# Patient Record
Sex: Female | Born: 1950 | Race: White | Hispanic: No | Marital: Married | Smoking: Former smoker
Health system: Southern US, Community
[De-identification: ages and names within clinical notes are randomized; demographics above are authoritative.]

## PROBLEM LIST (undated history)

## (undated) DIAGNOSIS — F32A Depression, unspecified: Secondary | ICD-10-CM

## (undated) DIAGNOSIS — E079 Disorder of thyroid, unspecified: Secondary | ICD-10-CM

## (undated) DIAGNOSIS — F329 Major depressive disorder, single episode, unspecified: Secondary | ICD-10-CM

## (undated) DIAGNOSIS — E785 Hyperlipidemia, unspecified: Secondary | ICD-10-CM

## (undated) DIAGNOSIS — I10 Essential (primary) hypertension: Secondary | ICD-10-CM

## (undated) HISTORY — DX: Disorder of thyroid, unspecified: E07.9

## (undated) HISTORY — DX: Major depressive disorder, single episode, unspecified: F32.9

## (undated) HISTORY — DX: Essential (primary) hypertension: I10

## (undated) HISTORY — DX: Depression, unspecified: F32.A

## (undated) HISTORY — DX: Hyperlipidemia, unspecified: E78.5

---

## 2001-09-22 ENCOUNTER — Other Ambulatory Visit: Admission: RE | Admit: 2001-09-22 | Discharge: 2001-09-22 | Payer: Self-pay | Admitting: Family Medicine

## 2003-05-10 ENCOUNTER — Other Ambulatory Visit: Admission: RE | Admit: 2003-05-10 | Discharge: 2003-05-10 | Payer: Self-pay | Admitting: Family Medicine

## 2005-03-23 ENCOUNTER — Other Ambulatory Visit: Admission: RE | Admit: 2005-03-23 | Discharge: 2005-03-23 | Payer: Self-pay | Admitting: Family Medicine

## 2006-05-25 ENCOUNTER — Other Ambulatory Visit: Admission: RE | Admit: 2006-05-25 | Discharge: 2006-05-25 | Payer: Self-pay | Admitting: Family Medicine

## 2006-06-16 ENCOUNTER — Encounter (HOSPITAL_COMMUNITY): Admission: RE | Admit: 2006-06-16 | Discharge: 2006-07-16 | Payer: Self-pay | Admitting: Endocrinology

## 2006-07-30 ENCOUNTER — Encounter (HOSPITAL_COMMUNITY): Admission: RE | Admit: 2006-07-30 | Discharge: 2006-08-29 | Payer: Self-pay | Admitting: Endocrinology

## 2007-10-30 IMAGING — NM NM THYROID IMAGING ONLY
4 series · 4 of 4 positions shown · non-contrast
Comparison: none

CLINICAL DATA: 55-year-old woman, history of thyroid goiter.  
 THYROID IMAGING STUDY:
 Thyroid scan was performed after the intravenous injection of approximately 10 mCi UcTTm Pertechnetate.  Images were performed in the anterior LAO and RAO projection.  These images showed a slightly enlarged gland with minimal thinning and/or patchy distribution of activity.  However, there were no discrete masses or nodules seen.  The Technetium visual uptake estimate was approximately 50% which is normal.

[Series 1: th thyroid scan · 1.09mm/px · 1 of 1 slices shown (1 of 4)]
[im 1/1]
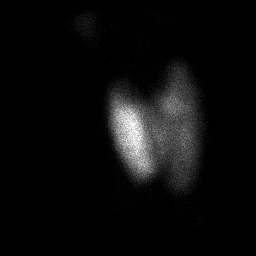

[Series 1: th thyroid scan · 1.09mm/px · 1 of 1 slices shown (2 of 4)]
[im 1/1]
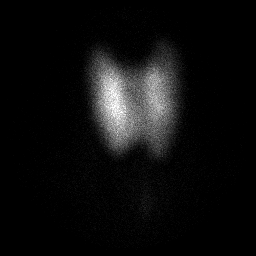

[Series 1: th thyroid scan · 1.09mm/px · 1 of 1 slices shown (3 of 4)]
[im 1/1]
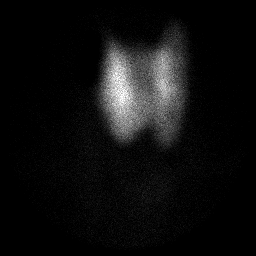

[Series 1: th thyroid scan · 1.09mm/px · 1 of 1 slices shown (4 of 4)]
[im 1/1]
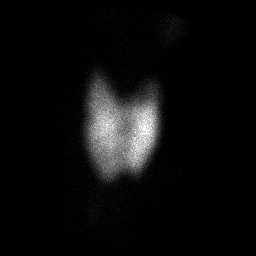

[4 of 4 positions shown; findings below may reference images not displayed]

IMPRESSION: 1.  Normal Technetium visual uptake estimate of about 15%. 
 2.  Mildly enlarged thyroid with minimal thinning and/or patchiness but without discrete cold or hot nodules.

## 2010-08-24 ENCOUNTER — Encounter: Payer: Self-pay | Admitting: Endocrinology

## 2012-10-15 ENCOUNTER — Other Ambulatory Visit: Payer: Self-pay | Admitting: *Deleted

## 2012-10-15 DIAGNOSIS — Z780 Asymptomatic menopausal state: Secondary | ICD-10-CM

## 2012-11-23 ENCOUNTER — Ambulatory Visit (INDEPENDENT_AMBULATORY_CARE_PROVIDER_SITE_OTHER): Payer: BC Managed Care – PPO | Admitting: Pharmacist

## 2012-11-23 ENCOUNTER — Other Ambulatory Visit: Payer: Self-pay

## 2012-11-23 ENCOUNTER — Ambulatory Visit (INDEPENDENT_AMBULATORY_CARE_PROVIDER_SITE_OTHER): Payer: BC Managed Care – PPO

## 2012-11-23 VITALS — BP 124/68 | HR 70 | Ht 62.5 in | Wt 170.0 lb

## 2012-11-23 DIAGNOSIS — I10 Essential (primary) hypertension: Secondary | ICD-10-CM | POA: Insufficient documentation

## 2012-11-23 DIAGNOSIS — M8580 Other specified disorders of bone density and structure, unspecified site: Secondary | ICD-10-CM | POA: Insufficient documentation

## 2012-11-23 DIAGNOSIS — M899 Disorder of bone, unspecified: Secondary | ICD-10-CM

## 2012-11-23 DIAGNOSIS — E785 Hyperlipidemia, unspecified: Secondary | ICD-10-CM

## 2012-11-23 DIAGNOSIS — E039 Hypothyroidism, unspecified: Secondary | ICD-10-CM

## 2012-11-23 DIAGNOSIS — Z780 Asymptomatic menopausal state: Secondary | ICD-10-CM

## 2012-11-23 DIAGNOSIS — E782 Mixed hyperlipidemia: Secondary | ICD-10-CM | POA: Insufficient documentation

## 2012-11-23 DIAGNOSIS — F32A Depression, unspecified: Secondary | ICD-10-CM | POA: Insufficient documentation

## 2012-11-23 DIAGNOSIS — F329 Major depressive disorder, single episode, unspecified: Secondary | ICD-10-CM

## 2012-11-23 NOTE — Progress Notes (Signed)
[facility] Current Height: Height: 5' 2.5" (158.8 cm)      Max Lifetime Height:  5' 2.5"    Current Weight: Weight: 170 lb (77.111 kg)       Ethnicity:Caucasian  BP: BP: 124/68 mmHg     HR:  Pulse Rate: 70      HPI: Does pt already have a diagnosis of:  Osteopenia?  Yes Osteoporosis?  No  Back Pain?  No       Kyphosis?  No Prior fracture?  No Med(s) for Osteoporosis/Osteopenia:  none Med(s) previously tried for Osteoporosis/Osteopenia:  Fosamax - patient stopped because to expensive  Patient also requests refill on atorvastatin 10mg  1 tablet daily.  Her last lipid panel was 03/2012.  LDL-P was 1059, Tg - 181, ZO-109, HDL - 64.  Patient's previous PCP was Paulita Cradle, NP who is no longer at our practice.  Patient need appt to establish care with new PCP.                                                             PMH: Age at menopause:  62yo Hysterectomy?  No Oophorectomy?  No HRT? Yes - Former.  Type/duration: - prempro, activella, estradiol +  medroxyprogesterone Steroid Use?  No Thyroid med?  Yes History of cancer?  No History of digestive disorders (ie Crohn's)?  No Current or previous eating disorders?  No Last Vitamin D Result:  32 (03/2012) Last GFR Result:  89 (03/2012)   FH/SH: Family history of osteoporosis?  No Parent with history of hip fracture?  No Family history of breast cancer?  No Exercise?  Yes -walking some Caffeine?  Yes - 1 cup coffee daily, 2 sodas daily Smoking?  No Alcohol?  No    Calcium Assessment Calcium Intake  # of servings/day  Calcium mg  Milk (8 oz) 2  x  300  = 600mg   Yogurt (4 oz) 3x/wk x  200 = 100 mg  Cheese (1 oz) varies x  200 =    Other Calcium sources     Ca supplement None = 0   Estimated calcium intake per day 700mg     DEXA Results Date of Test T-Score for AP Spine L1-L4 T-Score for Total Left Hip T-Score for Total Right Hip  11/23/2012 -0.6 -1.3 -1.4                   Previsou DEXA results from Dr.  Patrecia Pace from 07/18/2008:  Lumbar spine (L1-L4)  T-Score = =0.06  Total Hip  T-Score = -1.29  Femerol Neck T-Score = -1.49  FRAX 10 year estimate: Total FX risk:  7.6%  (consider medication if >/= 20%) Hip FX risk:  0.6% (consider medication if >/= 3%)  Assessment: Osteopenia - stable Low calcium intake Hyperlipidemia - due recheck labs but not fasting today.   Recommendations: recommend calcium 1200mg  daily either through supplementation or diet.   recommend weight bearing exercise - 30 minutes at least 4 days per week.   Counseled and educated about fall risk and prevention Rx refill called in for atorvastatin 10mg  1 tablet daily.   #30/0 RF and appt made with Ruthell Rummage for labs and follow-up chronic conditions for May 2014 Recheck DEXA:  2 years  Time spent counseling patient:  30 minutes

## 2012-11-23 NOTE — Patient Instructions (Signed)
Increase Calcium intake to 1200mg  daily through diet and supplements Weight bearing exercie (such as walking) daily at least 4 days per week    Fall Prevention and Home Safety Falls cause injuries and can affect all age groups. It is possible to use preventive measures to significantly decrease the likelihood of falls. There are many simple measures which can make your home safer and prevent falls. OUTDOORS  Repair cracks and edges of walkways and driveways.  Remove high doorway thresholds.  Trim shrubbery on the main path into your home.  Have good outside lighting.  Clear walkways of tools, rocks, debris, and clutter.  Check that handrails are not broken and are securely fastened. Both sides of steps should have handrails.  Have leaves, snow, and ice cleared regularly.  Use sand or salt on walkways during winter months.  In the garage, clean up grease or oil spills. BATHROOM  Install night lights.  Install grab bars by the toilet and in the tub and shower.  Use non-skid mats or decals in the tub or shower.  Place a plastic non-slip stool in the shower to sit on, if needed.  Keep floors dry and clean up all water on the floor immediately.  Remove soap buildup in the tub or shower on a regular basis.  Secure bath mats with non-slip, double-sided rug tape.  Remove throw rugs and tripping hazards from the floors. BEDROOMS  Install night lights.  Make sure a bedside light is easy to reach.  Do not use oversized bedding.  Keep a telephone by your bedside.  Have a firm chair with side arms to use for getting dressed.  Remove throw rugs and tripping hazards from the floor. KITCHEN  Keep handles on pots and pans turned toward the center of the stove. Use back burners when possible.  Clean up spills quickly and allow time for drying.  Avoid walking on wet floors.  Avoid hot utensils and knives.  Position shelves so they are not too high or low.  Place  commonly used objects within easy reach.  If necessary, use a sturdy step stool with a grab bar when reaching.  Keep electrical cables out of the way.  Do not use floor polish or wax that makes floors slippery. If you must use wax, use non-skid floor wax.  Remove throw rugs and tripping hazards from the floor. STAIRWAYS  Never leave objects on stairs.  Place handrails on both sides of stairways and use them. Fix any loose handrails. Make sure handrails on both sides of the stairways are as long as the stairs.  Check carpeting to make sure it is firmly attached along stairs. Make repairs to worn or loose carpet promptly.  Avoid placing throw rugs at the top or bottom of stairways, or properly secure the rug with carpet tape to prevent slippage. Get rid of throw rugs, if possible.  Have an electrician put in a light switch at the top and bottom of the stairs. OTHER FALL PREVENTION TIPS  Wear low-heel or rubber-soled shoes that are supportive and fit well. Wear closed toe shoes.  When using a stepladder, make sure it is fully opened and both spreaders are firmly locked. Do not climb a closed stepladder.  Add color or contrast paint or tape to grab bars and handrails in your home. Place contrasting color strips on first and last steps.  Learn and use mobility aids as needed. Install an electrical emergency response system.  Turn on lights to avoid dark  areas. Replace light bulbs that burn out immediately. Get light switches that glow.  Arrange furniture to create clear pathways. Keep furniture in the same place.  Firmly attach carpet with non-skid or double-sided tape.  Eliminate uneven floor surfaces.  Select a carpet pattern that does not visually hide the edge of steps.  Be aware of all pets. OTHER HOME SAFETY TIPS  Set the water temperature for 120 F (48.8 C).  Keep emergency numbers on or near the telephone.  Keep smoke detectors on every level of the home and near  sleeping areas. Document Released: 07/10/2002 Document Revised: 01/19/2012 Document Reviewed: 10/09/2011 [facility] Patient Information 2013 [place], [place].

## 2012-12-23 ENCOUNTER — Ambulatory Visit: Payer: Self-pay | Admitting: General Practice

## 2013-01-05 ENCOUNTER — Ambulatory Visit (INDEPENDENT_AMBULATORY_CARE_PROVIDER_SITE_OTHER): Payer: BC Managed Care – PPO | Admitting: General Practice

## 2013-01-05 ENCOUNTER — Encounter: Payer: Self-pay | Admitting: General Practice

## 2013-01-05 VITALS — BP 138/74 | HR 56 | Temp 97.4°F | Ht 62.0 in | Wt 166.0 lb

## 2013-01-05 DIAGNOSIS — E039 Hypothyroidism, unspecified: Secondary | ICD-10-CM

## 2013-01-05 DIAGNOSIS — F3289 Other specified depressive episodes: Secondary | ICD-10-CM

## 2013-01-05 DIAGNOSIS — E785 Hyperlipidemia, unspecified: Secondary | ICD-10-CM

## 2013-01-05 DIAGNOSIS — F329 Major depressive disorder, single episode, unspecified: Secondary | ICD-10-CM

## 2013-01-05 DIAGNOSIS — I10 Essential (primary) hypertension: Secondary | ICD-10-CM

## 2013-01-05 DIAGNOSIS — F32A Depression, unspecified: Secondary | ICD-10-CM

## 2013-01-05 LAB — POCT CBC
Granulocyte percent: 60.6 %G (ref 37–80)
HCT, POC: 39.7 % (ref 37.7–47.9)
MCV: 80.3 fL (ref 80–97)
POC Granulocyte: 4.5 (ref 2–6.9)
RBC: 4.9 M/uL (ref 4.04–5.48)

## 2013-01-05 LAB — COMPLETE METABOLIC PANEL WITH GFR
AST: 17 U/L (ref 0–37)
Albumin: 4.4 g/dL (ref 3.5–5.2)
Alkaline Phosphatase: 80 U/L (ref 39–117)
BUN: 13 mg/dL (ref 6–23)
Calcium: 9.5 mg/dL (ref 8.4–10.5)
Chloride: 107 mEq/L (ref 96–112)
GFR, Est Non African American: >89 mL/min
Glucose, Bld: 90 mg/dL (ref 70–99)
Potassium: 4 mEq/L (ref 3.5–5.3)
Sodium: 143 mEq/L (ref 135–145)
Total Protein: 6.9 g/dL (ref 6.0–8.3)

## 2013-01-05 LAB — THYROID PANEL WITH TSH
Free Thyroxine Index: 2.1 (ref 1.0–3.9)
T3 Uptake: 32.9 % (ref 22.5–37.0)

## 2013-01-05 MED ORDER — AMLODIPINE BESYLATE 5 MG PO TABS: 5.0000 mg | ORAL_TABLET | Freq: Every day | ORAL | Status: DC

## 2013-01-05 MED ORDER — LEVOTHYROXINE SODIUM 75 MCG PO TABS: 75.0000 ug | ORAL_TABLET | Freq: Every day | ORAL | Status: DC

## 2013-01-05 MED ORDER — ATORVASTATIN CALCIUM 10 MG PO TABS: 10.0000 mg | ORAL_TABLET | Freq: Every day | ORAL | Status: DC

## 2013-01-05 MED ORDER — SERTRALINE HCL 50 MG PO TABS: 50.0000 mg | ORAL_TABLET | Freq: Every day | ORAL | Status: DC

## 2013-01-05 NOTE — Patient Instructions (Signed)
Hypertension As your heart beats, it forces blood through your arteries. This force is your blood pressure. If the pressure is too high, it is called hypertension (HTN) or high blood pressure. HTN is dangerous because you may have it and not know it. High blood pressure may mean that your heart has to work harder to pump blood. Your arteries may be narrow or stiff. The extra work puts you at risk for heart disease, stroke, and other problems.  Blood pressure consists of two numbers, a higher number over a lower, 110/72, for example. It is stated as "110 over 72." The ideal is below 120 for the top number (systolic) and under 80 for the bottom (diastolic). Write down your blood pressure today. You should pay close attention to your blood pressure if you have certain conditions such as:  Heart failure.  Prior heart attack.  Diabetes  Chronic kidney disease.  Prior stroke.  Multiple risk factors for heart disease. To see if you have HTN, your blood pressure should be measured while you are seated with your arm held at the level of the heart. It should be measured at least twice. A one-time elevated blood pressure reading (especially in the Emergency Department) does not mean that you need treatment. There may be conditions in which the blood pressure is different between your right and left arms. It is important to see your caregiver soon for a recheck. Most people have essential hypertension which means that there is not a specific cause. This type of high blood pressure may be lowered by changing lifestyle factors such as:  Stress.  Smoking.  Lack of exercise.  Excessive weight.  Drug/tobacco/alcohol use.  Eating less salt. Most people do not have symptoms from high blood pressure until it has caused damage to the body. Effective treatment can often prevent, delay or reduce that damage. TREATMENT  When a cause has been identified, treatment for high blood pressure is directed at the  cause. There are a large number of medications to treat HTN. These fall into several categories, and your caregiver will help you select the medicines that are best for you. Medications may have side effects. You should review side effects with your caregiver. If your blood pressure stays high after you have made lifestyle changes or started on medicines,   Your medication(s) may need to be changed.  Other problems may need to be addressed.  Be certain you understand your prescriptions, and know how and when to take your medicine.  Be sure to follow up with your caregiver within the time frame advised (usually within two weeks) to have your blood pressure rechecked and to review your medications.  If you are taking more than one medicine to lower your blood pressure, make sure you know how and at what times they should be taken. Taking two medicines at the same time can result in blood pressure that is too low. SEEK IMMEDIATE MEDICAL CARE IF:  You develop a severe headache, blurred or changing vision, or confusion.  You have unusual weakness or numbness, or a faint feeling.  You have severe chest or abdominal pain, vomiting, or breathing problems. MAKE SURE YOU:   Understand these instructions.  Will watch your condition.  Will get help right away if you are not doing well or get worse. Document Released: 07/20/2005 Document Revised: 10/12/2011 Document Reviewed: 03/09/2008 [facility] Patient Information 2014 [place], [place]. Hyperthyroidism The thyroid is a large gland located in the lower front part of your neck.  The thyroid helps control metabolism. Metabolism is how your body uses food. It controls metabolism with the hormone thyroxine. When the thyroid is overactive, it produces too much hormone. When this happens, these following problems may occur:   Nervousness  Heat intolerance  Weight loss (in spite of increase food intake)  Diarrhea  Change in hair or skin  texture  Palpitations (heart skipping or having extra beats)  Tachycardia (rapid heart rate)  Loss of menstruation (amenorrhea)  Shaking of the hands CAUSES  Grave's Disease (the immune system attacks the thyroid gland). This is the most common cause.  Inflammation of the thyroid gland.  Tumor (usually benign) in the thyroid gland or elsewhere.  Excessive use of thyroid medications (both prescription and 'natural').  Excessive ingestion of Iodine. DIAGNOSIS  To prove hyperthyroidism, your caregiver may do blood tests and ultrasound tests. Sometimes the signs are hidden. It may be necessary for your caregiver to watch this illness with blood tests, either before or after diagnosis and treatment. TREATMENT Short-term treatment There are several treatments to control symptoms. Drugs called beta blockers may give some relief. Drugs that decrease hormone production will provide temporary relief in many people. These measures will usually not give permanent relief. Definitive therapy There are treatments available which can be discussed between you and your caregiver which will permanently treat the problem. These treatments range from surgery (removal of the thyroid), to the use of radioactive iodine (destroys the thyroid by radiation), to the use of antithyroid drugs (interfere with hormone synthesis). The first two treatments are permanent and usually successful. They most often require hormone replacement therapy for life. This is because it is impossible to remove or destroy the exact amount of thyroid required to make a person euthyroid (normal). HOME CARE INSTRUCTIONS  See your caregiver if the problems you are being treated for get worse. Examples of this would be the problems listed above. SEEK MEDICAL CARE IF: Your general condition worsens. MAKE SURE YOU:   Understand these instructions.  Will watch your condition.  Will get help right away if you are not doing well or get  worse. Document Released: 07/20/2005 Document Revised: 10/12/2011 Document Reviewed: 12/01/2006 [facility] Patient Information 2014 [place], [place].

## 2013-01-05 NOTE — Progress Notes (Signed)
  Subjective:    Patient ID: Lorraine Strickland, female    DOB: 1951/03/02, 62 y.o.   MRN: [id number]  HPI Patient presents today for 3 month follow up of chronic health conditions. Hypertension, hyperlipidemia, depression, hypothyroidism. Reports taking medications as directed. Denies taking blood pressure at home. Reports mood is well controlled with medications.     Review of Systems  Constitutional: Negative for fever and chills.  HENT: Negative for ear pain and neck pain.   Respiratory: Negative for chest tightness, shortness of breath and wheezing.   Cardiovascular: Negative for chest pain and palpitations.  Gastrointestinal: Negative for abdominal pain and abdominal distention.  Genitourinary: Negative for dysuria, frequency, hematuria and difficulty urinating.  Skin: Negative.   Neurological: Negative for dizziness, weakness and headaches.  Psychiatric/Behavioral: Negative for suicidal ideas and self-injury.  All other systems reviewed and are negative.       Objective:   Physical Exam  Constitutional: She is oriented to person, place, and time. She appears well-developed and well-nourished.  HENT:  Head: Normocephalic and atraumatic.  Right Ear: External ear normal.  Left Ear: External ear normal.  Mouth/Throat: Oropharynx is clear and moist.  Eyes: EOM are normal.  Cardiovascular: Normal rate, regular rhythm and normal heart sounds.   Pulmonary/Chest: Effort normal and breath sounds normal. No respiratory distress. She exhibits no tenderness.  Neurological: She is alert and oriented to person, place, and time.  Skin: Skin is warm and dry.  Psychiatric: She has a normal mood and affect.          Assessment & Plan:  1. Essential hypertension, benign - POCT CBC - amLODipine (NORVASC) 5 MG tablet; Take 1 tablet (5 mg total) by mouth daily.  Dispense: 30 tablet; Refill: 3  2. Other and unspecified hyperlipidemia - COMPLETE METABOLIC PANEL WITH GFR - NMR Lipoprofile  with Lipids - atorvastatin (LIPITOR) 10 MG tablet; Take 1 tablet (10 mg total) by mouth daily.  Dispense: 30 tablet; Refill: 3  3. Hypothyroidism - Thyroid Panel With TSH - levothyroxine (SYNTHROID, LEVOTHROID) 75 MCG tablet; Take 1 tablet (75 mcg total) by mouth daily before breakfast.  Dispense: 30 tablet; Refill: 3  4. Depression - sertraline (ZOLOFT) 50 MG tablet; Take 1 tablet (50 mg total) by mouth daily.  Dispense: 30 tablet; Refill: 3 -continue meds as prescribed -continue working on healthy eating habits and exercise -RTO if symptoms develop -Patient verbalized understanding -Coralie Keens, FNP-C

## 2013-01-06 ENCOUNTER — Other Ambulatory Visit: Payer: Self-pay | Admitting: General Practice

## 2013-01-06 DIAGNOSIS — E039 Hypothyroidism, unspecified: Secondary | ICD-10-CM

## 2013-01-06 LAB — NMR LIPOPROFILE WITH LIPIDS
Cholesterol, Total: 204 mg/dL — ABNORMAL HIGH (ref <200)
LDL Particle Number: 1426 nmol/L — ABNORMAL HIGH (ref <1000)
Large HDL-P: 7.3 umol/L (ref >=4.8)
Large VLDL-P: 2.4 nmol/L (ref <=2.7)
Small LDL Particle Number: 435 nmol/L (ref <=527)
Triglycerides: 147 mg/dL (ref <150)
VLDL Size: 42.7 nm (ref <=46.6)

## 2013-01-06 MED ORDER — LEVOTHYROXINE SODIUM 88 MCG PO TABS: 88.0000 ug | ORAL_TABLET | Freq: Every day | ORAL | Status: DC

## 2013-01-09 NOTE — Progress Notes (Signed)
Husband aware.

## 2013-01-10 NOTE — Progress Notes (Signed)
Pt aware of labs and med at pharmacy

## 2013-02-20 ENCOUNTER — Telehealth: Payer: Self-pay | Admitting: General Practice

## 2013-02-21 NOTE — Telephone Encounter (Signed)
She should be taking and script sent on June 6 to [facility]. thx

## 2013-02-27 NOTE — Telephone Encounter (Signed)
Pt has been taking only half of thyroid med dose. Was given  by Dr.Morayati and told to take it that way  (four years earlier).  She wants to continue taking but wants to be sure of dose. If she needs higher dose, is it clear that she would be taking half a tablet equaling ?

## 2013-03-03 NOTE — Telephone Encounter (Signed)
Patient should follow up with physician who instructed her to take 1/2 dose if she wishes to continue taking that way. I will be glad to manage but would do so based on TSH level. thx

## 2013-03-06 NOTE — Telephone Encounter (Signed)
Patient had only been taking 1/2 of the 75 for 4 years. She is willing to go up but wasn't sure if she needed to go up to 88. I noticed that it had her as taking a whole levothyroxine instead of 1/2 so i think that might be where the confusion is coming in

## 2013-05-15 ENCOUNTER — Other Ambulatory Visit: Payer: Self-pay

## 2013-05-15 DIAGNOSIS — E785 Hyperlipidemia, unspecified: Secondary | ICD-10-CM

## 2013-05-15 MED ORDER — ATORVASTATIN CALCIUM 10 MG PO TABS: 10.0000 mg | ORAL_TABLET | Freq: Every day | ORAL | Status: DC

## 2013-05-18 ENCOUNTER — Other Ambulatory Visit: Payer: Self-pay | Admitting: General Practice

## 2013-05-22 ENCOUNTER — Telehealth: Payer: Self-pay | Admitting: General Practice

## 2013-05-22 DIAGNOSIS — E785 Hyperlipidemia, unspecified: Secondary | ICD-10-CM

## 2013-05-23 MED ORDER — ATORVASTATIN CALCIUM 10 MG PO TABS: 10.0000 mg | ORAL_TABLET | Freq: Every day | ORAL | Status: DC

## 2013-05-23 NOTE — Telephone Encounter (Signed)
done

## 2013-05-24 ENCOUNTER — Telehealth: Payer: Self-pay | Admitting: Nurse Practitioner

## 2013-05-24 NOTE — Telephone Encounter (Signed)
error 

## 2013-06-19 ENCOUNTER — Ambulatory Visit (INDEPENDENT_AMBULATORY_CARE_PROVIDER_SITE_OTHER): Payer: BC Managed Care – PPO | Admitting: Nurse Practitioner

## 2013-06-19 ENCOUNTER — Encounter: Payer: Self-pay | Admitting: Nurse Practitioner

## 2013-06-19 VITALS — BP 142/77 | HR 58 | Temp 97.1°F | Ht 62.0 in | Wt 164.0 lb

## 2013-06-19 DIAGNOSIS — E039 Hypothyroidism, unspecified: Secondary | ICD-10-CM

## 2013-06-19 DIAGNOSIS — Z0000 Encounter for general adult medical examination without abnormal findings: Secondary | ICD-10-CM

## 2013-06-19 DIAGNOSIS — Z01419 Encounter for gynecological examination (general) (routine) without abnormal findings: Secondary | ICD-10-CM

## 2013-06-19 DIAGNOSIS — E782 Mixed hyperlipidemia: Secondary | ICD-10-CM

## 2013-06-19 DIAGNOSIS — F329 Major depressive disorder, single episode, unspecified: Secondary | ICD-10-CM

## 2013-06-19 DIAGNOSIS — Z124 Encounter for screening for malignant neoplasm of cervix: Secondary | ICD-10-CM

## 2013-06-19 DIAGNOSIS — Z23 Encounter for immunization: Secondary | ICD-10-CM

## 2013-06-19 DIAGNOSIS — E785 Hyperlipidemia, unspecified: Secondary | ICD-10-CM

## 2013-06-19 DIAGNOSIS — I10 Essential (primary) hypertension: Secondary | ICD-10-CM

## 2013-06-19 DIAGNOSIS — F32A Depression, unspecified: Secondary | ICD-10-CM

## 2013-06-19 LAB — POCT CBC
Granulocyte percent: 59.3 %G (ref 37–80)
HCT, POC: 40.7 % (ref 37.7–47.9)
Lymph, poc: 2.6 (ref 0.6–3.4)
MCH, POC: 24.8 pg — AB (ref 27–31.2)
MCHC: 31.2 g/dL — AB (ref 31.8–35.4)
MCV: 79.5 fL — AB (ref 80–97)
Platelet Count, POC: 231 10*3/uL (ref 142–424)
RDW, POC: 13.9 %
WBC: 7.4 10*3/uL (ref 4.6–10.2)

## 2013-06-19 LAB — POCT URINALYSIS DIPSTICK
Glucose, UA: NEGATIVE
Leukocytes, UA: NEGATIVE
Nitrite, UA: NEGATIVE
Protein, UA: NEGATIVE
Urobilinogen, UA: NEGATIVE

## 2013-06-19 MED ORDER — HYDROCHLOROTHIAZIDE 12.5 MG PO CAPS: 12.5000 mg | ORAL_CAPSULE | Freq: Every day | ORAL | Status: DC

## 2013-06-19 MED ORDER — SERTRALINE HCL 50 MG PO TABS: 50.0000 mg | ORAL_TABLET | Freq: Every day | ORAL | Status: DC

## 2013-06-19 MED ORDER — AMLODIPINE BESYLATE 5 MG PO TABS: 5.0000 mg | ORAL_TABLET | Freq: Every day | ORAL | Status: DC

## 2013-06-19 MED ORDER — ATORVASTATIN CALCIUM 10 MG PO TABS: 10.0000 mg | ORAL_TABLET | Freq: Every day | ORAL | Status: DC

## 2013-06-19 NOTE — Progress Notes (Signed)
Subjective:    Patient ID: Lorraine Strickland, female    DOB: [birth year], 62 y.o.   MRN: [id number]   Patient here today for CPE and PAP- doing well without complaints.  Hypertension This is a chronic problem. The current episode started more than 1 year ago. The problem is unchanged. The problem is controlled. Pertinent negatives include no blurred vision, chest pain, headaches, neck pain, palpitations, peripheral edema or shortness of breath. Agents associated with hypertension include thyroid hormones. Risk factors for coronary artery disease include dyslipidemia, family history and post-menopausal state. Past treatments include calcium channel blockers and diuretics. The current treatment provides moderate improvement. Compliance problems include diet and exercise.  Hypertensive end-organ damage includes a thyroid problem.  Hyperlipidemia This is a chronic problem. The current episode started more than 1 year ago. The problem is uncontrolled. Recent lipid tests were reviewed and are high. Exacerbating diseases include hypothyroidism. She has no history of diabetes or obesity. Pertinent negatives include no chest pain or shortness of breath. Current antihyperlipidemic treatment includes statins. The current treatment provides moderate improvement of lipids. Compliance problems include adherence to diet and adherence to exercise.  Risk factors for coronary artery disease include hypertension and post-menopausal.  Thyroid Problem Visit type: hypothyroidism. Patient reports no anxiety, constipation, diaphoresis, diarrhea, dry skin, hair loss, hoarse voice, menstrual problem, palpitations, visual change, weight gain or weight loss. The symptoms have been stable. Her past medical history is significant for hyperlipidemia. There is no history of diabetes.  depression zoloft working well for her- keeps her from worrying so much   Review of Systems  Constitutional: Negative for weight loss, weight gain and  diaphoresis.  HENT: Negative for hoarse voice.   Eyes: Negative for blurred vision.  Respiratory: Negative for shortness of breath.   Cardiovascular: Negative for chest pain and palpitations.  Gastrointestinal: Negative for diarrhea and constipation.  Genitourinary: Negative for menstrual problem.  Musculoskeletal: Negative for neck pain.  Neurological: Negative for headaches.       Objective:   Physical Exam  Constitutional: She is oriented to person, place, and time. She appears well-developed and well-nourished.  HENT:  Head: Normocephalic.  Right Ear: Hearing, tympanic membrane, external ear and ear canal normal.  Left Ear: Hearing, tympanic membrane, external ear and ear canal normal.  Nose: Nose normal.  Mouth/Throat: Uvula is midline and oropharynx is clear and moist.  Eyes: Conjunctivae and EOM are normal. Pupils are equal, round, and reactive to light.  Neck: Normal range of motion and full passive range of motion without pain. Neck supple. No JVD present. Carotid bruit is not present. No mass and no thyromegaly present.  Cardiovascular: Normal rate, normal heart sounds and intact distal pulses.   No murmur heard. Pulmonary/Chest: Effort normal and breath sounds normal. Right breast exhibits no inverted nipple, no mass, no nipple discharge, no skin change and no tenderness. Left breast exhibits no mass, no nipple discharge, no skin change and no tenderness.  Abdominal: Soft. Bowel sounds are normal. She exhibits no mass. There is no tenderness.  Genitourinary: Vagina normal and uterus normal. No breast swelling, tenderness, discharge or bleeding.  bimanual exam-No adnexal masses or tenderness. Cervix non parous and pink - no discharge  Musculoskeletal: Normal range of motion.  Lymphadenopathy:    She has no cervical adenopathy.  Neurological: She is alert and oriented to person, place, and time.  Skin: Skin is warm and dry.  Psychiatric: She has a normal mood and affect.  Her behavior is normal.  Judgment and thought content normal.    BP 142/77  Pulse 58  Temp(Src) 97.1 F (36.2 C) (Oral)  Ht 5\' 2"  (1.575 m)  Wt 164 lb (74.39 kg)  BMI 29.99 kg/m2       Assessment & Plan:   1. Annual physical exam   2. Encounter for routine gynecological examination   3. Hyperlipemia, mixed   4. Essential hypertension, benign   5. Unspecified hypothyroidism   6. Depression   7. Other and unspecified hyperlipidemia    Orders Placed This Encounter  Procedures  . CMP14+EGFR  . NMR, lipoprofile  . Thyroid Panel With TSH  . POCT urinalysis dipstick  . POCT CBC   Meds ordered this encounter  Medications  . levothyroxine (SYNTHROID, LEVOTHROID) 75 MCG tablet    Sig: Take 75 mcg by mouth daily before breakfast. Take 1/2 tab qd  . sertraline (ZOLOFT) 50 MG tablet    Sig: Take 1 tablet (50 mg total) by mouth daily.    Dispense:  90 tablet    Refill:  1    Order Specific Question:  Supervising Provider    Answer:  Ernestina Penna [[id number]]  . amLODipine (NORVASC) 5 MG tablet    Sig: Take 1 tablet (5 mg total) by mouth daily.    Dispense:  90 tablet    Refill:  1    Order Specific Question:  Supervising Provider    Answer:  Ernestina Penna [[id number]]  . hydrochlorothiazide (MICROZIDE) 12.5 MG capsule    Sig: Take 1 capsule (12.5 mg total) by mouth daily.    Dispense:  90 capsule    Refill:  1    Order Specific Question:  Supervising Provider    Answer:  Ernestina Penna [[id number]]  . atorvastatin (LIPITOR) 10 MG tablet    Sig: Take 1 tablet (10 mg total) by mouth daily.    Dispense:  90 tablet    Refill:  1    Order Specific Question:  Supervising Provider    Answer:  Deborra Medina    Continue all meds Labs pending Diet and exercise encouraged Health maintenance reviewed Follow up in 6 months  Mary-Margaret Daphine Deutscher, FNP

## 2013-06-19 NOTE — Patient Instructions (Signed)

## 2013-06-21 LAB — CMP14+EGFR
AST: 21 IU/L (ref 0–40)
Albumin: 4.5 g/dL (ref 3.6–4.8)
Alkaline Phosphatase: 78 IU/L (ref 39–117)
BUN/Creatinine Ratio: 18 (ref 11–26)
BUN: 13 mg/dL (ref 8–27)
Chloride: 104 mmol/L (ref 97–108)
GFR calc Af Amer: 106 mL/min/{1.73_m2} (ref >59)
Sodium: 142 mmol/L (ref 134–144)
Total Bilirubin: 0.3 mg/dL (ref 0.0–1.2)

## 2013-06-21 LAB — NMR, LIPOPROFILE
Cholesterol: 174 mg/dL (ref <200)
HDL Particle Number: 44.3 umol/L (ref >=30.5)
LDL Particle Number: 1260 nmol/L — ABNORMAL HIGH (ref <1000)
LDL Size: 21 nm (ref >20.5)
LP-IR Score: 55 — ABNORMAL HIGH (ref <=45)

## 2013-06-21 LAB — THYROID PANEL WITH TSH
T3 Uptake Ratio: 26 % (ref 24–39)
TSH: 8.58 u[IU]/mL — ABNORMAL HIGH (ref 0.450–4.500)

## 2013-06-22 ENCOUNTER — Other Ambulatory Visit: Payer: Self-pay | Admitting: Nurse Practitioner

## 2013-06-22 MED ORDER — LEVOTHYROXINE SODIUM 88 MCG PO TABS: 88.0000 ug | ORAL_TABLET | Freq: Every day | ORAL | Status: DC

## 2013-06-23 LAB — PAP IG W/ RFLX HPV ASCU

## 2013-07-31 ENCOUNTER — Other Ambulatory Visit: Payer: Self-pay

## 2013-07-31 DIAGNOSIS — E785 Hyperlipidemia, unspecified: Secondary | ICD-10-CM

## 2013-07-31 MED ORDER — ATORVASTATIN CALCIUM 10 MG PO TABS: 10.0000 mg | ORAL_TABLET | Freq: Every day | ORAL | Status: DC

## 2013-08-15 ENCOUNTER — Telehealth: Payer: Self-pay | Admitting: *Deleted

## 2013-08-15 ENCOUNTER — Telehealth: Payer: Self-pay | Admitting: Nurse Practitioner

## 2013-08-15 ENCOUNTER — Ambulatory Visit (INDEPENDENT_AMBULATORY_CARE_PROVIDER_SITE_OTHER): Payer: BC Managed Care – PPO | Admitting: Family Medicine

## 2013-08-15 ENCOUNTER — Encounter: Payer: Self-pay | Admitting: Family Medicine

## 2013-08-15 VITALS — BP 124/67 | HR 71 | Temp 96.7°F | Ht 62.0 in | Wt 164.0 lb

## 2013-08-15 DIAGNOSIS — R059 Cough, unspecified: Secondary | ICD-10-CM

## 2013-08-15 DIAGNOSIS — R05 Cough: Secondary | ICD-10-CM

## 2013-08-15 DIAGNOSIS — J329 Chronic sinusitis, unspecified: Secondary | ICD-10-CM

## 2013-08-15 MED ORDER — AZELASTINE HCL 0.1 % NA SOLN: 1.0000 | Freq: Two times a day (BID) | NASAL | Status: AC

## 2013-08-15 MED ORDER — FLUTICASONE PROPIONATE 50 MCG/ACT NA SUSP: 2.0000 | Freq: Every day | NASAL | Status: AC

## 2013-08-15 MED ORDER — AMOXICILLIN-POT CLAVULANATE 875-125 MG PO TABS: 1.0000 | ORAL_TABLET | Freq: Two times a day (BID) | ORAL | Status: DC

## 2013-08-15 MED ORDER — FLUTICASONE PROPIONATE 50 MCG/ACT NA SUSP: 2.0000 | Freq: Every day | NASAL | Status: DC

## 2013-08-15 NOTE — Progress Notes (Signed)
Subjective:    Patient ID: Lorraine Strickland, female    DOB: 06/16/1951, 63 y.o.   MRN: [id number]  HPI Patient here today for congestion and head pressure. This has been going on for a couple of days. She'll take his head shoulders. There is some slight cough. In both ears are stopped up.     Patient Active Problem List   Diagnosis Date Noted  . Hyperlipemia, mixed 11/23/2012  . Unspecified hypothyroidism 11/23/2012  . Depression 11/23/2012  . Essential hypertension, benign 11/23/2012  . Osteopenia 11/23/2012   Outpatient Encounter Prescriptions as of 08/15/2013  Medication Sig  . amLODipine (NORVASC) 5 MG tablet Take 1 tablet (5 mg total) by mouth daily.  Marland Kitchen. atorvastatin (LIPITOR) 10 MG tablet Take 1 tablet (10 mg total) by mouth daily.  . hydrochlorothiazide (MICROZIDE) 12.5 MG capsule Take 1 capsule (12.5 mg total) by mouth daily.  Marland Kitchen. levothyroxine (SYNTHROID, LEVOTHROID) 88 MCG tablet Take 1 tablet (88 mcg total) by mouth daily.  . sertraline (ZOLOFT) 50 MG tablet Take 1 tablet (50 mg total) by mouth daily.    Review of Systems  Constitutional: Negative.   HENT: Positive for congestion, ear pain ("stopped up") and sinus pressure.   Eyes: Negative.   Respiratory: Positive for cough (little).   Cardiovascular: Negative.   Gastrointestinal: Negative.   Endocrine: Negative.   Genitourinary: Negative.   Musculoskeletal: Negative.   Skin: Negative.   Allergic/Immunologic: Negative.   Neurological: Negative.  Negative for dizziness and headaches.  Hematological: Negative.   Psychiatric/Behavioral: Negative.        Objective:   Physical Exam  Nursing note and vitals reviewed. Constitutional: She is oriented to person, place, and time. She appears well-developed and well-nourished. No distress.  HENT:  Head: Normocephalic and atraumatic.  Right Ear: External ear normal.  Left Ear: External ear normal.  Mouth/Throat: Oropharynx is clear and moist. No oropharyngeal exudate.   Nasal congestion and turbinate swelling bilaterally  Eyes: Conjunctivae and EOM are normal. Pupils are equal, round, and reactive to light. Right eye exhibits no discharge. Left eye exhibits no discharge. No scleral icterus.  Neck: Normal range of motion. Neck supple. No thyromegaly present.  Cardiovascular: Normal rate, regular rhythm, normal heart sounds and intact distal pulses.  Exam reveals no gallop and no friction rub.   No murmur heard. Pulmonary/Chest: Effort normal and breath sounds normal. No respiratory distress. She has no wheezes. She has no rales. She exhibits no tenderness.  Upper airway congestion with coughing  Abdominal: Soft. Bowel sounds are normal.  Musculoskeletal: Normal range of motion.  Lymphadenopathy:    She has no cervical adenopathy.  Neurological: She is alert and oriented to person, place, and time.  Skin: Skin is warm and dry. No rash noted.  Psychiatric: She has a normal mood and affect. Her behavior is normal. Judgment and thought content normal.   BP 124/67  Pulse 71  Temp(Src) 96.7 F (35.9 C) (Oral)  Ht 5\' 2"  (1.575 m)  Wt 164 lb (74.39 kg)  BMI 29.99 kg/m2        Assessment & Plan:   1. Rhinosinusitis -Augmentin 875 one twice a day x10 day -Fluticasone nasal spray 1-2 sprays each nostril at bedtime -Astelin nose spray 2 sprays each nostril at bedtime  2. Cough -Mucinex maximum strength one twice daily with a large glass of water -Saline nose spray throughout the day  Patient Instructions  Take medication as directed Use a cool mist humidifier in her bedroom  at nighttime Use saline nose spray frequently during the day He is a prescriptionat bedtime Use Mucinex maximum strength one twice daily over-the-counter with a large glass of water   Nyra Capes MD

## 2013-08-15 NOTE — Telephone Encounter (Signed)
Pharm saved - new - in epic

## 2013-08-15 NOTE — Patient Instructions (Signed)
Take medication as directed Use a cool mist humidifier in her bedroom at nighttime Use saline nose spray frequently during the day He is a prescriptionat bedtime Use Mucinex maximum strength one twice daily over-the-counter with a large glass of water

## 2013-08-22 NOTE — Telephone Encounter (Signed)
Seen 08/15/13 at [facility] by dr. Christell Constantmoore

## 2013-11-13 ENCOUNTER — Telehealth: Payer: Self-pay | Admitting: Nurse Practitioner

## 2013-11-13 NOTE — Telephone Encounter (Signed)
app[facility] scheduled for 8:15 [facility]

## 2013-11-14 ENCOUNTER — Encounter: Payer: Self-pay | Admitting: Nurse Practitioner

## 2013-11-14 ENCOUNTER — Ambulatory Visit (INDEPENDENT_AMBULATORY_CARE_PROVIDER_SITE_OTHER): Payer: BC Managed Care – PPO | Admitting: Nurse Practitioner

## 2013-11-14 VITALS — BP 126/79 | HR 60 | Temp 98.5°F | Ht 62.0 in | Wt 165.6 lb

## 2013-11-14 DIAGNOSIS — J209 Acute bronchitis, unspecified: Secondary | ICD-10-CM

## 2013-11-14 MED ORDER — AZITHROMYCIN 250 MG PO TABS: ORAL_TABLET | ORAL | Status: DC

## 2013-11-14 MED ORDER — BENZONATATE 100 MG PO CAPS: 100.0000 mg | ORAL_CAPSULE | Freq: Two times a day (BID) | ORAL | Status: DC | PRN

## 2013-11-14 MED ORDER — HYDROCODONE-HOMATROPINE 5-1.5 MG/5ML PO SYRP: 5.0000 mL | ORAL_SOLUTION | Freq: Three times a day (TID) | ORAL | Status: DC | PRN

## 2013-11-14 MED ORDER — METHYLPREDNISOLONE ACETATE 80 MG/ML IJ SUSP
80.0000 mg | Freq: Once | INTRAMUSCULAR | Status: AC
  Administered 2013-11-14: 80 mg via INTRAMUSCULAR

## 2013-11-14 NOTE — Patient Instructions (Signed)

## 2013-11-14 NOTE — Progress Notes (Signed)
Subjective:    Patient ID: Lorraine Strickland, female    DOB: 10/14/1950, 63 y.o.   MRN: [id number]  HPI Patient in this morning c/o cough and congestion- started Saturday morning-Has tried mucinea OTC- no relief.    Review of Systems  Constitutional: Positive for chills and appetite change. Negative for fever.  HENT: Positive for congestion, postnasal drip and sore throat (started this AM). Negative for ear pain, sinus pressure and sneezing.   Respiratory: Positive for cough (nonproductive).   Cardiovascular: Negative.   Gastrointestinal: Negative.   Neurological: Negative.   Psychiatric/Behavioral: Negative.   All other systems reviewed and are negative.      Objective:   Physical Exam  Constitutional: She is oriented to person, place, and time. She appears well-developed and well-nourished.  HENT:  Right Ear: Hearing, tympanic membrane, external ear and ear canal normal.  Left Ear: Hearing, tympanic membrane, external ear and ear canal normal.  Nose: Mucosal edema and rhinorrhea present. Right sinus exhibits no maxillary sinus tenderness and no frontal sinus tenderness. Left sinus exhibits no maxillary sinus tenderness and no frontal sinus tenderness.  Mouth/Throat: Uvula is midline, oropharynx is clear and moist and mucous membranes are normal.  Eyes: Pupils are equal, round, and reactive to light.  Neck: Normal range of motion. Neck supple.  Cardiovascular: Normal rate, regular rhythm and normal heart sounds.   Pulmonary/Chest: Effort normal and breath sounds normal. No respiratory distress. She has no wheezes.  Deep dry cough  Lymphadenopathy:    She has no cervical adenopathy.  Neurological: She is alert and oriented to person, place, and time.  Skin: Skin is warm and dry.  Psychiatric: She has a normal mood and affect. Her behavior is normal. Judgment and thought content normal.   BP 126/79  Pulse 60  Temp(Src) 98.5 F (36.9 C) (Oral)  Ht 5\' 2"  (1.575 m)  Wt 165 lb  9.6 oz (75.116 kg)  BMI 30.28 kg/m2        Assessment & Plan:   1. Acute bronchitis    Meds ordered this encounter  Medications  . methylPREDNISolone acetate (DEPO-MEDROL) injection 80 mg    Sig:   . azithromycin (ZITHROMAX Z-PAK) 250 MG tablet    Sig: As directed    Dispense:  6 each    Refill:  0    Order Specific Question:  Supervising Provider    Answer:  Ernestina PennaMOORE, DONALD W [[id number]]  . benzonatate (TESSALON) 100 MG capsule    Sig: Take 1 capsule (100 mg total) by mouth 2 (two) times daily as needed for cough.    Dispense:  20 capsule    Refill:  0    Order Specific Question:  Supervising Provider    Answer:  Ernestina PennaMOORE, DONALD W [[id number]]  . HYDROcodone-homatropine (HYCODAN) 5-1.5 MG/5ML syrup    Sig: Take 5 mLs by mouth every 8 (eight) hours as needed for cough.    Dispense:  120 mL    Refill:  0    Order Specific Question:  Supervising Provider    Answer:  Ernestina PennaMOORE, DONALD W [[id number]]   1. Take meds as prescribed 2. Use a cool mist humidifier especially during the winter months and when heat has been humid. 3. Use saline nose sprays frequently 4. Saline irrigations of the nose can be very helpful if done frequently.  * 4X daily for 1 week*  * Use of a nettie pot can be helpful with this. Follow directions with this* 5. Drink plenty of  fluids 6. Keep thermostat turn down low 7.For any cough or congestion  Use plain Mucinex- regular strength or max strength is fine   * Children- consult with Pharmacist for dosing 8. For fever or aces or pains- take tylenol or ibuprofen appropriate for age and weight.  * for fevers greater than 101 orally you may alternate ibuprofen and tylenol every  3 hours.   Lorraine Daphine DeutscherMartin, FNP

## 2013-12-28 ENCOUNTER — Other Ambulatory Visit: Payer: Self-pay | Admitting: Nurse Practitioner

## 2014-01-01 ENCOUNTER — Other Ambulatory Visit: Payer: Self-pay

## 2014-01-01 MED ORDER — SERTRALINE HCL 50 MG PO TABS: ORAL_TABLET | ORAL | Status: DC

## 2014-01-01 MED ORDER — AMLODIPINE BESYLATE 5 MG PO TABS: ORAL_TABLET | ORAL | Status: DC

## 2014-01-22 ENCOUNTER — Other Ambulatory Visit: Payer: Self-pay | Admitting: Nurse Practitioner

## 2014-02-16 ENCOUNTER — Other Ambulatory Visit: Payer: Self-pay | Admitting: General Practice

## 2014-02-19 NOTE — Telephone Encounter (Signed)
Patient had thyroid checked in Nov 2014. Was to have levels repeated in 6-8-weeks but did not return for labs. Please advise on refill

## 2014-02-19 NOTE — Telephone Encounter (Signed)
Patient NTBS for follow up and lab work  

## 2014-04-18 ENCOUNTER — Other Ambulatory Visit: Payer: Self-pay | Admitting: Nurse Practitioner

## 2014-04-20 ENCOUNTER — Telehealth: Payer: Self-pay | Admitting: Nurse Practitioner

## 2014-04-20 MED ORDER — LEVOTHYROXINE SODIUM 88 MCG PO TABS: 88.0000 ug | ORAL_TABLET | Freq: Every day | ORAL | Status: DC

## 2014-04-20 NOTE — Telephone Encounter (Signed)
done

## 2014-04-24 ENCOUNTER — Telehealth: Payer: Self-pay | Admitting: Nurse Practitioner

## 2014-04-24 MED ORDER — ATORVASTATIN CALCIUM 10 MG PO TABS: ORAL_TABLET | ORAL | Status: DC

## 2014-04-24 NOTE — Telephone Encounter (Signed)
I sent the Rx to the pharmacy.

## 2014-06-26 ENCOUNTER — Ambulatory Visit (INDEPENDENT_AMBULATORY_CARE_PROVIDER_SITE_OTHER): Payer: BC Managed Care – PPO | Admitting: Nurse Practitioner

## 2014-06-26 ENCOUNTER — Encounter: Payer: Self-pay | Admitting: Nurse Practitioner

## 2014-06-26 VITALS — BP 131/74 | HR 58 | Temp 98.1°F | Ht 62.0 in | Wt 166.0 lb

## 2014-06-26 DIAGNOSIS — I10 Essential (primary) hypertension: Secondary | ICD-10-CM

## 2014-06-26 DIAGNOSIS — F32A Depression, unspecified: Secondary | ICD-10-CM

## 2014-06-26 DIAGNOSIS — Z6830 Body mass index (BMI) 30.0-30.9, adult: Secondary | ICD-10-CM

## 2014-06-26 DIAGNOSIS — Z0000 Encounter for general adult medical examination without abnormal findings: Secondary | ICD-10-CM

## 2014-06-26 DIAGNOSIS — Z01419 Encounter for gynecological examination (general) (routine) without abnormal findings: Secondary | ICD-10-CM

## 2014-06-26 DIAGNOSIS — E782 Mixed hyperlipidemia: Secondary | ICD-10-CM

## 2014-06-26 DIAGNOSIS — F329 Major depressive disorder, single episode, unspecified: Secondary | ICD-10-CM

## 2014-06-26 DIAGNOSIS — M8580 Other specified disorders of bone density and structure, unspecified site: Secondary | ICD-10-CM

## 2014-06-26 DIAGNOSIS — E039 Hypothyroidism, unspecified: Secondary | ICD-10-CM

## 2014-06-26 LAB — POCT URINALYSIS DIPSTICK
BILIRUBIN UA: NEGATIVE
Blood, UA: NEGATIVE
Glucose, UA: NEGATIVE
Ketones, UA: NEGATIVE
Leukocytes, UA: NEGATIVE
Nitrite, UA: NEGATIVE
PROTEIN UA: NEGATIVE
Spec Grav, UA: 1.01
Urobilinogen, UA: NEGATIVE
pH, UA: 5

## 2014-06-26 LAB — POCT UA - MICROSCOPIC ONLY
Bacteria, U Microscopic: NEGATIVE
CRYSTALS, UR, HPF, POC: NEGATIVE
Casts, Ur, LPF, POC: NEGATIVE
Mucus, UA: NEGATIVE
RBC, URINE, MICROSCOPIC: NEGATIVE
Yeast, UA: NEGATIVE

## 2014-06-26 LAB — POCT CBC
Granulocyte percent: 62.5 %G (ref 37–80)
HEMATOCRIT: 40.1 % (ref 37.7–47.9)
Hemoglobin: 12.9 g/dL (ref 12.2–16.2)
Lymph, poc: 2.6 (ref 0.6–3.4)
MCH, POC: 26.1 pg — AB (ref 27–31.2)
MCHC: 32.2 g/dL (ref 31.8–35.4)
MCV: 81.1 fL (ref 80–97)
MPV: 7.9 fL (ref 0–99.8)
POC Granulocyte: 5.3 (ref 2–6.9)
POC LYMPH PERCENT: 30.7 %L (ref 10–50)
Platelet Count, POC: 246 10*3/uL (ref 142–424)
RBC: 5 M/uL (ref 4.04–5.48)
RDW, POC: 13.7 %
WBC: 8.5 10*3/uL (ref 4.6–10.2)

## 2014-06-26 MED ORDER — HYDROCHLOROTHIAZIDE 12.5 MG PO CAPS: 12.5000 mg | ORAL_CAPSULE | Freq: Every day | ORAL | Status: AC

## 2014-06-26 MED ORDER — SERTRALINE HCL 50 MG PO TABS: ORAL_TABLET | ORAL | Status: DC

## 2014-06-26 MED ORDER — ATORVASTATIN CALCIUM 10 MG PO TABS: ORAL_TABLET | ORAL | Status: AC

## 2014-06-26 MED ORDER — AMLODIPINE BESYLATE 5 MG PO TABS: ORAL_TABLET | ORAL | Status: DC

## 2014-06-26 MED ORDER — LEVOTHYROXINE SODIUM 88 MCG PO TABS: ORAL_TABLET | ORAL | Status: DC

## 2014-06-26 NOTE — Patient Instructions (Signed)

## 2014-06-26 NOTE — Progress Notes (Signed)
Subjective:    Patient ID: Lorraine Strickland, female    DOB: 06-29-1951, 63 y.o.   MRN: [id number]   Patient here today for CPE and PAP- doing well without complaints.  Hypertension This is a chronic problem. The current episode started more than 1 year ago. The problem is unchanged. The problem is controlled. Pertinent negatives include no chest pain, headaches, neck pain, palpitations or shortness of breath. Risk factors for coronary artery disease include stress, dyslipidemia and post-menopausal state. Past treatments include calcium channel blockers. The current treatment provides moderate improvement. Compliance problems include diet and exercise.  Hypertensive end-organ damage includes a thyroid problem.  Hyperlipidemia This is a chronic problem. The current episode started more than 1 year ago. The problem is controlled. Recent lipid tests were reviewed and are normal. Exacerbating diseases include obesity. She has no history of diabetes or hypothyroidism. Pertinent negatives include no chest pain or shortness of breath. Current antihyperlipidemic treatment includes statins. The current treatment provides moderate improvement of lipids. Compliance problems include adherence to diet and adherence to exercise.  Risk factors for coronary artery disease include dyslipidemia, family history, hypertension, obesity and post-menopausal.  Thyroid Problem Presents for follow-up (hypothyroidism) visit. Patient reports no constipation, diaphoresis, diarrhea, menstrual problem, palpitations or visual change. The symptoms have been stable. Her past medical history is significant for hyperlipidemia. There is no history of diabetes.  depression zoloft working well for her- keeps her from worrying so much   Review of Systems  Constitutional: Negative for diaphoresis.  Respiratory: Negative for shortness of breath.   Cardiovascular: Negative for chest pain and palpitations.  Gastrointestinal: Negative for  diarrhea and constipation.  Genitourinary: Negative for menstrual problem.  Musculoskeletal: Negative for neck pain.  Neurological: Negative for headaches.       Objective:   Physical Exam  Constitutional: She is oriented to person, place, and time. She appears well-developed and well-nourished.  HENT:  Head: Normocephalic.  Right Ear: Hearing, tympanic membrane, external ear and ear canal normal.  Left Ear: Hearing, tympanic membrane, external ear and ear canal normal.  Nose: Nose normal.  Mouth/Throat: Uvula is midline and oropharynx is clear and moist.  Eyes: Conjunctivae and EOM are normal. Pupils are equal, round, and reactive to light.  Neck: Normal range of motion and full passive range of motion without pain. Neck supple. No JVD present. Carotid bruit is not present. No thyroid mass and no thyromegaly present.  Cardiovascular: Normal rate, normal heart sounds and intact distal pulses.   No murmur heard. Pulmonary/Chest: Effort normal and breath sounds normal. Right breast exhibits no inverted nipple, no mass, no nipple discharge, no skin change and no tenderness. Left breast exhibits no inverted nipple, no mass, no nipple discharge, no skin change and no tenderness.  Abdominal: Soft. Bowel sounds are normal. She exhibits no mass. There is no tenderness.  Genitourinary: Vagina normal and uterus normal. Guaiac negative stool. No breast swelling, tenderness, discharge or bleeding.  bimanual exam-No adnexal masses or tenderness. Cervix parous and pink- no discharge  Musculoskeletal: Normal range of motion.  Lymphadenopathy:    She has no cervical adenopathy.  Neurological: She is alert and oriented to person, place, and time.  Skin: Skin is warm and dry.  Psychiatric: She has a normal mood and affect. Her behavior is normal. Judgment and thought content normal.    BP 131/74 mmHg  Pulse 58  Temp(Src) 98.1 F (36.7 C) (Oral)  Ht 5' 2"  (1.575 m)  Wt 166 lb (75.297 kg)  BMI  30.35 kg/m2  Results for orders placed or performed in visit on 06/26/14  POCT UA - Microscopic Only  Result Value Ref Range   WBC, Ur, HPF, POC 1-3    RBC, urine, microscopic neg    Bacteria, U Microscopic neg    Mucus, UA neg    Epithelial cells, urine per micros occ    Crystals, Ur, HPF, POC neg    Casts, Ur, LPF, POC neg    Yeast, UA neg   POCT urinalysis dipstick  Result Value Ref Range   Color, UA yellow    Clarity, UA clear    Glucose, UA neg    Bilirubin, UA neg    Ketones, UA neg    Spec Grav, UA 1.010    Blood, UA neg    pH, UA 5.0    Protein, UA neg    Urobilinogen, UA negative    Nitrite, UA neg    Leukocytes, UA Negative          Assessment & Plan:   1. Annual physical exam - POCT UA - Microscopic Only - POCT urinalysis dipstick - POCT CBC  2. Encounter for routine gynecological examination - Pap IG w/ reflex to HPV when ASC-U  3. Essential hypertension, benign Do not add salt to diet - CMP14+EGFR - amLODipine (NORVASC) 5 MG tablet; TAKE ONE TABLET BY MOUTH ONCE DAILY  Dispense: 90 tablet; Refill: 1 - hydrochlorothiazide (MICROZIDE) 12.5 MG capsule; Take 1 capsule (12.5 mg total) by mouth daily.  Dispense: 90 capsule; Refill: 1  4. Hypothyroidism, unspecified hypothyroidism type - Thyroid Panel With TSH - levothyroxine (SYNTHROID, LEVOTHROID) 88 MCG tablet; TAKE ONE TABLET BY MOUTH ONCE DAILY BEFORE BREAKFAST  Dispense: 90 tablet; Refill: 1  5. Hyperlipemia, mixed Low fat diet - NMR, lipoprofile - atorvastatin (LIPITOR) 10 MG tablet; TAKE ONE TABLET BY MOUTH ONCE DAILY  Dispense: 90 tablet; Refill: 1  6. Depression Stress management - sertraline (ZOLOFT) 50 MG tablet; TAKE ONE TABLET BY MOUTH ONCE DAILY  Dispense: 90 tablet; Refill: 1  7. Osteopenia dexa due next year  8. BMI 30.0-30.9,adult Discussed diet and exercise for person with BMI >25 Will recheck weight in 3-6 months   Labs pending Health maintenance reviewed Diet and  exercise encouraged Continue all meds Follow up  In 6 months   [place], FNP

## 2014-06-27 LAB — PAP IG W/ RFLX HPV ASCU: PAP Smear Comment: 0

## 2014-06-27 LAB — CMP14+EGFR
ALK PHOS: 78 IU/L (ref 39–117)
ALT: 19 IU/L (ref 0–32)
AST: 18 IU/L (ref 0–40)
Albumin/Globulin Ratio: 1.9 (ref 1.1–2.5)
Albumin: 4.3 g/dL (ref 3.6–4.8)
BILIRUBIN TOTAL: 0.4 mg/dL (ref 0.0–1.2)
BUN / CREAT RATIO: 13 (ref 11–26)
BUN: 12 mg/dL (ref 8–27)
CHLORIDE: 100 mmol/L (ref 97–108)
CO2: 25 mmol/L (ref 18–29)
Calcium: 9.3 mg/dL (ref 8.7–10.3)
Creatinine, Ser: 0.92 mg/dL (ref 0.57–1.00)
GFR calc non Af Amer: 67 mL/min/{1.73_m2} (ref >59)
GFR, EST AFRICAN AMERICAN: 77 mL/min/{1.73_m2} (ref >59)
GLUCOSE: 70 mg/dL (ref 65–99)
Globulin, Total: 2.3 g/dL (ref 1.5–4.5)
POTASSIUM: 3.9 mmol/L (ref 3.5–5.2)
Sodium: 141 mmol/L (ref 134–144)
Total Protein: 6.6 g/dL (ref 6.0–8.5)

## 2014-06-27 LAB — NMR, LIPOPROFILE
Cholesterol: 177 mg/dL (ref 100–199)
HDL Cholesterol by NMR: 73 mg/dL (ref >39)
HDL Particle Number: 48.6 umol/L (ref >=30.5)
LDL PARTICLE NUMBER: 910 nmol/L (ref <1000)
LDL SIZE: 21.3 nm (ref >20.5)
LDL-C: 67 mg/dL (ref 0–99)
LP-IR SCORE: 43 (ref <=45)
Small LDL Particle Number: 382 nmol/L (ref <=527)
Triglycerides by NMR: 183 mg/dL — ABNORMAL HIGH (ref 0–149)

## 2014-06-27 LAB — THYROID PANEL WITH TSH
Free Thyroxine Index: 2.6 (ref 1.2–4.9)
T3 UPTAKE RATIO: 30 % (ref 24–39)
T4 TOTAL: 8.5 ug/dL (ref 4.5–12.0)
TSH: 0.935 u[IU]/mL (ref 0.450–4.500)

## 2014-06-29 ENCOUNTER — Telehealth: Payer: Self-pay

## 2014-06-29 NOTE — Telephone Encounter (Signed)
-----   [facility] from [facility], FNP sent at 06/29/2014  9:51 AM EST ----- Cbc normal Kidney and liver function stable Cholesterol looks great Thyroid panel  Normal Pap normal- repeat in 2 years Continue current meds- low fat diet and exercise and recheck in 3 months

## 2014-06-29 NOTE — Telephone Encounter (Signed)
Letter sent with results

## 2015-01-01 ENCOUNTER — Other Ambulatory Visit: Payer: Self-pay | Admitting: Nurse Practitioner

## 2015-01-31 ENCOUNTER — Other Ambulatory Visit: Payer: Self-pay | Admitting: Nurse Practitioner

## 2015-02-12 ENCOUNTER — Other Ambulatory Visit: Payer: Self-pay | Admitting: Nurse Practitioner

## 2015-02-12 NOTE — Telephone Encounter (Signed)
No refill of lipitor until seen

## 2015-02-13 NOTE — Telephone Encounter (Signed)
Pt notified NTBS before refill

## 2015-02-18 ENCOUNTER — Telehealth: Payer: Self-pay | Admitting: Nurse Practitioner

## 2015-02-18 NOTE — Telephone Encounter (Signed)
Patient aware that she will need to be seen  

## 2015-02-18 NOTE — Telephone Encounter (Signed)
Needs to have labs before filling rx to make sure liver is ok. Sorry
